# Patient Record
Sex: Female | Born: 2000 | Race: White | Hispanic: No | Marital: Single | Smoking: Never smoker
Health system: Southern US, Community
[De-identification: ages and names within clinical notes are randomized; demographics above are authoritative.]

## PROBLEM LIST (undated history)

## (undated) DIAGNOSIS — K219 Gastro-esophageal reflux disease without esophagitis: Secondary | ICD-10-CM

## (undated) DIAGNOSIS — I10 Essential (primary) hypertension: Secondary | ICD-10-CM

## (undated) DIAGNOSIS — Z8719 Personal history of other diseases of the digestive system: Secondary | ICD-10-CM

## (undated) HISTORY — PX: SMALL INTESTINE SURGERY: SHX150

## (undated) HISTORY — DX: Personal history of other diseases of the digestive system: Z87.19

---
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
## 2020-12-05 ENCOUNTER — Other Ambulatory Visit: Payer: Self-pay

## 2020-12-05 ENCOUNTER — Encounter (HOSPITAL_COMMUNITY): Payer: Self-pay

## 2020-12-05 ENCOUNTER — Emergency Department (HOSPITAL_COMMUNITY)
Admission: EM | Admit: 2020-12-05 | Discharge: 2020-12-06 | Disposition: A | Discharge status: Home / Self Care | Payer: Self-pay | Attending: Emergency Medicine | Admitting: Emergency Medicine

## 2020-12-05 ENCOUNTER — Emergency Department (HOSPITAL_COMMUNITY): Payer: Self-pay

## 2020-12-05 DIAGNOSIS — W08XXXA Fall from other furniture, initial encounter: Secondary | ICD-10-CM | POA: Insufficient documentation

## 2020-12-05 DIAGNOSIS — W19XXXA Unspecified fall, initial encounter: Secondary | ICD-10-CM

## 2020-12-05 DIAGNOSIS — S42471A Displaced transcondylar fracture of right humerus, initial encounter for closed fracture: Secondary | ICD-10-CM | POA: Insufficient documentation

## 2020-12-05 DIAGNOSIS — S42491A Other displaced fracture of lower end of right humerus, initial encounter for closed fracture: Secondary | ICD-10-CM

## 2020-12-05 DIAGNOSIS — S42492A Other displaced fracture of lower end of left humerus, initial encounter for closed fracture: Secondary | ICD-10-CM | POA: Insufficient documentation

## 2020-12-05 DIAGNOSIS — Z20822 Contact with and (suspected) exposure to covid-19: Secondary | ICD-10-CM | POA: Insufficient documentation

## 2020-12-05 MED ORDER — FENTANYL CITRATE (PF) 100 MCG/2ML IJ SOLN
100.0000 ug | Freq: Once | INTRAMUSCULAR | Status: AC
  Administered 2020-12-06: 100 ug via INTRAVENOUS
  Filled 2020-12-05: qty 2

## 2020-12-05 MED ORDER — ONDANSETRON HCL 4 MG/2ML IJ SOLN
4.0000 mg | Freq: Once | INTRAMUSCULAR | Status: AC
  Administered 2020-12-06: 4 mg via INTRAVENOUS
  Filled 2020-12-05: qty 2

## 2020-12-05 MED ORDER — OXYCODONE-ACETAMINOPHEN 5-325 MG PO TABS
1.0000 | ORAL_TABLET | Freq: Once | ORAL | Status: AC
  Administered 2020-12-05: 1 via ORAL
  Filled 2020-12-05: qty 1

## 2020-12-05 NOTE — ED Provider Notes (Addendum)
[facility]-EMERGENCY DEPT Provider Note   CSN: [id number] Arrival date & time: 12/05/20  2217     History Chief Complaint  Patient presents with   Arm Injury    Vanessa Holmes is a 20 y.o. female.  The history is provided by the patient.  Arm Injury Location:  Elbow Elbow location:  R elbow Pain details:    Quality:  Aching   Radiates to:  Does not radiate   Severity:  Severe   Onset quality:  Sudden   Timing:  Constant   Progression:  Worsening Relieved by:  Nothing Worsened by:  Movement Associated symptoms: decreased range of motion   Associated symptoms: no back pain and no muscle weakness   Patient reports she was standing on a bench when she fell landing on her right arm.  She had sudden onset of pain in the right elbow.  No head injury or LOC. 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/04/2015   Left knee pain 10/02/2015   ADD (attention deficit disorder) 10/02/2015   Preventative health care 09/15/2015   H/O small bowel obstruction     Past Surgical History:  Procedure Laterality Date   SMALL INTESTINE SURGERY     53 days of age, bowel obstruction in Duodenum, redo 3 months later for scar tissure     OB History     Gravida  0   Para  0   Term  0   Preterm  0   AB  0   Living  0      SAB  0   IAB  0   Ectopic  0   Multiple  0   Live Births  0           Family History  Problem Relation Age of Onset   Hypertension Father    Diabetes Father    Cancer Maternal Grandmother        breast cancer in her 40's    Cancer Paternal Grandfather        cancer    Social History   Tobacco Use   Smoking status: Never   Smokeless tobacco: Never  Substance Use Topics   Alcohol use: No    Alcohol/week: 0.0 standard drinks   Drug use: No    Home Medications Prior to Admission medications   Medication Sig Start Date End Date Taking? Authorizing Provider  desogestrel-ethinyl estradiol (KARIVA) 0.15-0.02/0.01 MG (21/5) tablet Take 1 tablet by mouth daily. Patient not taking: Reported on 10/15/2017 02/28/17   Bradd Canary, MD  ibuprofen (ADVIL,MOTRIN) 200 MG tablet Take 200 mg by mouth every 6 (six) hours as needed.    [provider]  lisdexamfetamine (VYVANSE) 20 MG capsule Take 20 mg by mouth daily.    [provider]  norgestimate-ethinyl estradiol (ORTHO-CYCLEN,SPRINTEC,PREVIFEM) 0.25-35 MG-MCG tablet Take 1 tablet by mouth daily. Patient not taking: Reported on 05/07/2018 10/15/17   Currie Paris, NP    Allergies    Sulfa antibiotics  Review of Systems   Review of Systems  Gastrointestinal:  Negative  for abdominal pain.  Musculoskeletal:  Positive for arthralgias and joint swelling. Negative for back pain.  Neurological:  Negative for headaches.  All other systems reviewed and are negative.  Physical Exam Updated Vital Signs BP 128/75   Pulse 75   Temp 98.4 F (36.9 C) (Oral)   Resp 16   SpO2 96%   Physical Exam CONSTITUTIONAL: Well developed/well nourished HEAD: Normocephalic/atraumatic, no signs of trauma EYES: EOMI ENMT: Mucous membranes moist NECK: supple no meningeal signs SPINE/BACK: No cervical spine tenderness CV: S1/S2 noted, no murmurs/rubs/gallops noted LUNGS: Lungs are clear to auscultation bilaterally, no apparent distress ABDOMEN: soft, nontender NEURO: Pt is awake/alert/appropriate, moves all extremitiesx4.  No facial droop.  She is able to move the fingers on the right hand.  She reports numbness to the right hand EXTREMITIES: Obvious closed  deformity to the right elbow.  No lacerations.  Distal pulses are intact in both upper extremities. SKIN: warm, color normal PSYCH: Anxious  ED Results / Procedures / Treatments   Labs (all labs ordered are listed, but only abnormal results are displayed) Labs Reviewed  CBC - Abnormal; Notable for the following components:      Result Value   WBC 26.4 (*)    Platelets 404 (*)    All other components within normal limits  BASIC METABOLIC PANEL - Abnormal; Notable for the following components:   Potassium 3.4 (*)    Glucose, Bld 133 (*)    BUN 21 (*)    Creatinine, Ser 1.04 (*)    All other components within normal limits  RESP PANEL BY RT-PCR (FLU A&B, COVID) ARPGX2  I-STAT BETA HCG BLOOD, ED ([facility], [facility], [facility] ONLY)    EKG None  Radiology DG Elbow 2 Views Right  Result Date: 12/05/2020 CLINICAL DATA:  Fall, right elbow deformity EXAM: RIGHT ELBOW - 2 VIEW COMPARISON:  None. FINDINGS: Two view radiograph right elbow demonstrates a comminuted transcondylar/intra-articular fracture of the a distal right humerus with marked posterior angulation, 2 shaft with posterior displacement and approximately 3.5-4 cm override of the fracture fragments with superior displacement of the a distal humeral fracture fragments. Radiocapitellar alignment appears preserved. The trochlear groove appears disrupted with resultant articular incongruity involving the ulnohumeral articulation. IMPRESSION: Comminuted transcondylar/intra-articular fracture of the distal right humerus, as described above. Electronically Signed   By: Helyn Numbers MD   On: 12/05/2020 23:09   DG Humerus Right  Result Date: 12/05/2020 CLINICAL DATA:  Fall, right upper extremity deformity EXAM: RIGHT HUMERUS - 2+ VIEW COMPARISON:  None. FINDINGS: Two view radiograph right humerus demonstrates a comminuted intra-articular fracture of the distal right humerus with transchondral fracture planes as well as fracture planes extending into the  trochlear groove. There is marked override and displacement of the fracture fragments, not well profiled on this examination. There is articular incongruity involving the ulnohumeral articulation. IMPRESSION: Comminuted intra-articular, displaced fracture of the distal right humerus with resulting malalignment of the ulnohumeral articulation. Electronically Signed   By: Helyn Numbers MD   On: 12/05/2020 23:12    Procedures Procedures   Medications Ordered in ED Medications  fentaNYL (SUBLIMAZE) injection 100 mcg (has no administration in time range)  sodium chloride 0.9 % bolus 1,000 mL (1,000 mLs Intravenous New Bag/Given 12/06/20 0151)  oxyCODONE-acetaminophen (PERCOCET/ROXICET) 5-325 MG per tablet 1 tablet (1 tablet Oral Given 12/05/20 2312)  fentaNYL (SUBLIMAZE) injection 100 mcg (100 mcg Intravenous Given 12/06/20 0055)  ondansetron (ZOFRAN) injection 4 mg (4 mg Intravenous Given 12/06/20 0052)  ED Course  I have reviewed the triage vital signs and the nursing notes.  Pertinent labs & imaging results that were available during my care of the patient were reviewed by me and considered in my medical decision making (see chart for details).    MDM Rules/Calculators/A&P                         Patient with obvious fracture dislocation of the right elbow.  This is a closed injury 23:30 Discussed with Dr. Ophelia Charter with orthopedics.  He request to consult hand surgery 2345 Discussed with Dr. Merlyn Lot with hand surgery.  He reports he does not operate on elbows and requests call back to orthopedic 2350 D/w Dr Ophelia Charter who will see patient 12:41 AM Seen by Dr. Ophelia Charter.  He has splinted the patient and she is improved.  Plan will be to hold in the ER overnight and plan for OR in the morning. 1:58 AM Patient stable.  She will rest in the ER overnight until her surgery in the morning Final Clinical Impression(s) / ED Diagnoses Final diagnoses:  Fall  Other closed displaced fracture of distal end of  right humerus, initial encounter    Rx / DC Orders ED Discharge Orders     None        Zadie Rhine, MD 12/06/20 3662    Zadie Rhine, MD 12/06/20 0159

## 2020-12-05 NOTE — ED Triage Notes (Signed)
Pt BIB EMS. Pt was sitting on a bench and it fell backwards. Pt injured her right arms. Pt has obvious deformity to right elbow. Denies hitting her head or LOC.   22G LH 150 mcg Fentanyl

## 2020-12-05 NOTE — ED Provider Notes (Signed)
Emergency Medicine Provider Triage Evaluation Note  Vanessa Holmes , a 20 y.o. female  was evaluated in triage.  Pt complains of right elbow and arm pain.  Just prior to arrival sitting on a bench and fell backwards.  Injured right arm and elbow.  No pain elsewhere.  Did not hit head or lose consciousness.  No neck or back pain.  Review of Systems  Positive: R arm pain Negative: Headache, numbness  Physical Exam  BP 128/75   Pulse 75   Temp 98.4 F (36.9 C) (Oral)   Resp 16   SpO2 96%  Gen:   Awake, no distress   Resp:  Normal effort  MSK:   Deformity of his distal right upper arm.  Patient in splint limiting exam.  Radial pulse 2+.  Good distal sensation. Other:    Medical Decision Making  Medically screening exam initiated at 10:44 PM.  Appropriate orders placed.  Vanessa Holmes was informed that the remainder of the evaluation will be completed by another provider, this initial triage assessment does not replace that evaluation, and the importance of remaining in the ED until their evaluation is complete.  Xrays ordered   Vanessa Apley, PA-C 12/05/20 2245    Tilden Fossa, MD 12/05/20 2256

## 2020-12-06 ENCOUNTER — Encounter (HOSPITAL_COMMUNITY): Payer: Self-pay | Admitting: Orthopaedic Surgery

## 2020-12-06 ENCOUNTER — Ambulatory Visit: Payer: Self-pay | Admitting: Student

## 2020-12-06 DIAGNOSIS — S42471A Displaced transcondylar fracture of right humerus, initial encounter for closed fracture: Secondary | ICD-10-CM

## 2020-12-06 DIAGNOSIS — S42491A Other displaced fracture of lower end of right humerus, initial encounter for closed fracture: Secondary | ICD-10-CM

## 2020-12-06 DIAGNOSIS — S42401A Unspecified fracture of lower end of right humerus, initial encounter for closed fracture: Secondary | ICD-10-CM | POA: Insufficient documentation

## 2020-12-06 LAB — RESP PANEL BY RT-PCR (FLU A&B, COVID) ARPGX2
Influenza A by PCR: NEGATIVE
Influenza B by PCR: NEGATIVE
SARS Coronavirus 2 by RT PCR: NEGATIVE

## 2020-12-06 LAB — BASIC METABOLIC PANEL
Anion gap: 10 (ref 5–15)
BUN: 21 mg/dL — ABNORMAL HIGH (ref 6–20)
CO2: 24 mmol/L (ref 22–32)
Calcium: 9.8 mg/dL (ref 8.9–10.3)
Chloride: 106 mmol/L (ref 98–111)
Creatinine, Ser: 1.04 mg/dL — ABNORMAL HIGH (ref 0.44–1.00)
GFR, Estimated: >60 mL/min (ref >60)
Glucose, Bld: 133 mg/dL — ABNORMAL HIGH (ref 70–99)
Potassium: 3.4 mmol/L — ABNORMAL LOW (ref 3.5–5.1)
Sodium: 140 mmol/L (ref 135–145)

## 2020-12-06 LAB — CBC
HCT: 42.4 % (ref 36.0–46.0)
Hemoglobin: 14.3 g/dL (ref 12.0–15.0)
MCH: 30.8 pg (ref 26.0–34.0)
MCHC: 33.7 g/dL (ref 30.0–36.0)
MCV: 91.2 fL (ref 80.0–100.0)
Platelets: 404 10*3/uL — ABNORMAL HIGH (ref 150–400)
RBC: 4.65 MIL/uL (ref 3.87–5.11)
RDW: 12.6 % (ref 11.5–15.5)
WBC: 26.4 10*3/uL — ABNORMAL HIGH (ref 4.0–10.5)
nRBC: 0 % (ref 0.0–0.2)

## 2020-12-06 LAB — I-STAT BETA HCG BLOOD, ED (MC, WL, AP ONLY): I-stat hCG, quantitative: <5 m[IU]/mL (ref <5)

## 2020-12-06 MED ORDER — HYDROCODONE-ACETAMINOPHEN 5-325 MG PO TABS: 1.0000 | ORAL_TABLET | Freq: Four times a day (QID) | ORAL | 0 refills | Status: AC | PRN

## 2020-12-06 MED ORDER — ONDANSETRON HCL 4 MG/2ML IJ SOLN
4.0000 mg | Freq: Four times a day (QID) | INTRAMUSCULAR | Status: DC
  Administered 2020-12-06: 4 mg via INTRAVENOUS
  Filled 2020-12-06: qty 2

## 2020-12-06 MED ORDER — MORPHINE SULFATE (PF) 4 MG/ML IV SOLN
4.0000 mg | INTRAVENOUS | Status: DC | PRN
  Administered 2020-12-06 (×2): 4 mg via INTRAVENOUS
  Filled 2020-12-06 (×2): qty 1

## 2020-12-06 MED ORDER — SODIUM CHLORIDE 0.9 % IV BOLUS (SEPSIS)
1000.0000 mL | Freq: Once | INTRAVENOUS | Status: AC
  Administered 2020-12-06: 1000 mL via INTRAVENOUS

## 2020-12-06 MED ORDER — FENTANYL CITRATE (PF) 100 MCG/2ML IJ SOLN
100.0000 ug | INTRAMUSCULAR | Status: DC | PRN
  Administered 2020-12-06 (×2): 100 ug via INTRAVENOUS
  Filled 2020-12-06 (×2): qty 2

## 2020-12-06 MED ORDER — ONDANSETRON 8 MG PO TBDP: 8.0000 mg | ORAL_TABLET | Freq: Three times a day (TID) | ORAL | 0 refills | Status: AC | PRN

## 2020-12-06 NOTE — ED Notes (Signed)
Father at bedside.

## 2020-12-06 NOTE — ED Provider Notes (Signed)
Updated on plan.  Discussed with Dr. Ophelia Charter.  He has  posted her for surgical repair tomorrow June 29 Patient feels comfortable with this plan.  Distal neurovascular intact in right hand.  She has had some soreness in her right shoulder, but humerus  x-ray was negative for the shoulder joint  Dr. Ophelia Charter will call patient later this morning with surgical plan. Pain medications and Zofran have been sent to her pharmacy   Zadie Rhine, MD 12/06/20 [phone]

## 2020-12-06 NOTE — Progress Notes (Signed)
PCP - Heidi Dach PA-C @ [facility] Cardiologist - denies EKG - DOS Chest x-ray - n/a ECHO - denies Cardiac Cath - denies CPAP - denies  Blood Thinner Instructions: None Aspirin Instructions: n/a  ERAS Protcol - Clear liquids until 9:30  COVID TEST- negative on 6/27 @ ED  Anesthesia review: NO  Pt posted currently for surgery with Dr. Ophelia Charter.  Pt states she just completed an appt with Dr. Jena Gauss, surgery date, time, and surgeon may change.  -------------  SDW INSTRUCTIONS:  Your procedure is scheduled on June 29. Please report to [facility] Main Entrance "A" at 10:00 A.M., and check in at the Admitting office. Call this number if you have problems the morning of surgery: [phone]   Remember: Do not eat after midnight the night before your surgery  You may drink clear liquids until 09:30 the morning of your surgery.   Clear liquids allowed are: Water, Non-Citrus Juices (without pulp), Carbonated Beverages, Clear Tea, Black Coffee Only, and Gatorade   Medications to take morning of surgery with a sip of water include: If needed: Norco Zofran   As of today, STOP taking any Aspirin (unless otherwise instructed by your surgeon), Aleve, Naproxen, Ibuprofen, Motrin, Advil, Goody's, BC's, all herbal medications, fish oil, and all vitamins.    The Morning of Surgery Do not wear jewelry, make-up or nail polish. Do not wear lotions, powders, or perfumes/colognes, or deodorant Do not shave 48 hours prior to surgery.   Men may shave face and neck. Do not bring valuables to the hospital. [facility] is not responsible for any belongings or valuables.  If you are a smoker, DO NOT Smoke 24 hours prior to surgery If you wear a CPAP at night please bring your mask the morning of surgery  Remember that you must have someone to transport you home after your surgery, and remain with you for 24 hours if you are discharged the same day.  Please bring cases for contacts, glasses,  hearing aids, dentures or bridgework because it cannot be worn into surgery.   Patients discharged the day of surgery will not be allowed to drive home.   Please shower the NIGHT BEFORE/MORNING OF SURGERY (use antibacterial soap like DIAL soap if possible). Wear comfortable clothes the morning of surgery. Oral Hygiene is also important to reduce your risk of infection.  Remember - BRUSH YOUR TEETH THE MORNING OF SURGERY WITH YOUR REGULAR TOOTHPASTE  Patient denies shortness of breath, fever, cough and chest pain.

## 2020-12-06 NOTE — Progress Notes (Addendum)
Comminuted distal humerus Fx closed, intra-artic T condylar.  Splinted by me . Will talk with Trauma Ortho MD ( Haddix or Handy ) in AM about transfer to [facility] for surgery.   Ulnar , median, radial nerve intact.   Patient's cell is [phone]

## 2020-12-06 NOTE — Consult Note (Signed)
Reason for Consult: Closed right comminuted transcondylar intracondylar fracture distal right humerus Referring Physician: Zadie Rhine MD  Vanessa Holmes is an 20 y.o. female.  HPI: 20 year old female stood up on a bench to yell at her dog behind her parents business and bench tipped over with fall landing on outstretched dominant right upper extremity with close distal humerus comminuted fracture intra-articular.  She was splinted in extension with a foam splint by EMS.  She rates her pain is severe.  No past history of injury to the arm.  Patient is a Consulting civil engineer at [employer].  Past Medical History:  Diagnosis Date   H/O small bowel obstruction     Past Surgical History:  Procedure Laterality Date   SMALL INTESTINE SURGERY     56 days of age, bowel obstruction in Duodenum, redo 3 months later for scar tissure    Family History  Problem Relation Age of Onset   Hypertension Father    Diabetes Father    Cancer Maternal Grandmother        breast cancer in her 47's   Cancer Paternal Grandfather        cancer    Social History:  reports that she has never smoked. She has never used smokeless tobacco. She reports that she does not drink alcohol and does not use drugs.  Allergies:  Allergies  Allergen Reactions   Sulfa Antibiotics Hives and Rash    Medications: I have reviewed the patient's current medications.  Results for orders placed or performed during the hospital encounter of 12/05/20 (from the past 48 hour(s))  CBC     Status: Abnormal   Collection Time: 12/05/20 11:29 PM  Result Value Ref Range   WBC 26.4 (H) 4.0 - 10.5 K/uL   RBC 4.65 3.87 - 5.11 MIL/uL   Hemoglobin 14.3 12.0 - 15.0 g/dL   HCT 73.2 20.2 - 54.2 %   MCV 91.2 80.0 - 100.0 fL   MCH 30.8 26.0 - 34.0 pg   MCHC 33.7 30.0 - 36.0 g/dL   RDW 70.6 23.7 - 62.8 %   Platelets 404 (H) 150 - 400 K/uL   nRBC 0.0 0.0 - 0.2 %    Comment: Performed at [facility], [address], [place], [place] [address]  Basic metabolic panel     Status: Abnormal   Collection Time: 12/05/20 11:29 PM  Result Value Ref Range   Sodium 140 135 - 145 mmol/L   Potassium 3.4 (L) 3.5 - 5.1 mmol/L   Chloride 106 98 - 111 mmol/L   CO2 24 22 - 32 mmol/L   Glucose, Bld 133 (H) 70 - 99 mg/dL    Comment: Glucose reference range applies only to samples taken after fasting for at least 8 hours.   BUN 21 (H) 6 - 20 mg/dL   Creatinine, Ser 6.16 (H) 0.44 - 1.00 mg/dL   Calcium 9.8 8.9 - 07.3 mg/dL   GFR, Estimated >71 >06 mL/min    Comment: (NOTE) Calculated using the CKD-EPI Creatinine Equation (2021)    Anion gap 10 5 - 15    Comment: Performed at [facility], [address] [place], [place] [address]  I-Stat Beta hCG blood, ED ([facility], [facility], [facility] only)     Status: None   Collection Time: 12/06/20 12:58 AM  Result Value Ref Range   I-stat hCG, quantitative <5.0 <5 mIU/mL   Comment 3  Comment:   GEST. AGE      CONC.  (mIU/mL)   <=1 WEEK        5 - 50     2 WEEKS       50 - 500     3 WEEKS       100 - 10,000     4 WEEKS     1,000 - 30,000        FEMALE AND NON-PREGNANT FEMALE:     LESS THAN 5 mIU/mL   Resp Panel by RT-PCR (Flu A&B, Covid) Nasopharyngeal Swab     Status: None   Collection Time: 12/06/20  1:06 AM   Specimen: Nasopharyngeal Swab; Nasopharyngeal(NP) swabs in vial transport medium  Result Value Ref Range   SARS Coronavirus 2 by RT PCR NEGATIVE NEGATIVE    Comment: (NOTE) SARS-CoV-2 target nucleic acids are NOT DETECTED.  The SARS-CoV-2 RNA is generally detectable in upper respiratory specimens during the acute phase of infection. The lowest concentration of SARS-CoV-2 viral copies this assay can detect is 138 copies/mL. A negative result does not preclude SARS-Cov-2 infection and should not be used as the sole basis for treatment or other patient management decisions. A negative result may occur with  improper specimen collection/handling,  submission of specimen other than nasopharyngeal swab, presence of viral mutation(s) within the areas targeted by this assay, and inadequate number of viral copies(<138 copies/mL). A negative result must be combined with clinical observations, patient history, and epidemiological information. The expected result is Negative.  Fact Sheet for Patients:  BloggerCourse.com  Fact Sheet for Healthcare Providers:  SeriousBroker.it  This test is no t yet approved or cleared by the [place] FDA and  has been authorized for detection and/or diagnosis of SARS-CoV-2 by FDA under an Emergency Use Authorization (EUA). This EUA will remain  in effect (meaning this test can be used) for the duration of the COVID-19 declaration under Section 564(b)(1) of the Act, 21 U.S.C.section 360bbb-3(b)(1), unless the authorization is terminated  or revoked sooner.       Influenza A by PCR NEGATIVE NEGATIVE   Influenza B by PCR NEGATIVE NEGATIVE    Comment: (NOTE) The Xpert Xpress SARS-CoV-2/FLU/RSV plus assay is intended as an aid in the diagnosis of influenza from Nasopharyngeal swab specimens and should not be used as a sole basis for treatment. Nasal washings and aspirates are unacceptable for Xpert Xpress SARS-CoV-2/FLU/RSV testing.  Fact Sheet for Patients: BloggerCourse.com  Fact Sheet for Healthcare Providers: SeriousBroker.it  This test is not yet approved or cleared by the [place] FDA and has been authorized for detection and/or diagnosis of SARS-CoV-2 by FDA under an Emergency Use Authorization (EUA). This EUA will remain in effect (meaning this test can be used) for the duration of the COVID-19 declaration under Section 564(b)(1) of the Act, 21 U.S.C. section 360bbb-3(b)(1), unless the authorization is terminated or revoked.  Performed at [facility], [address] [place], [place] [address]     DG Elbow 2 Views Right  Result Date: 12/05/2020 CLINICAL DATA:  Fall, right elbow deformity EXAM: RIGHT ELBOW - 2 VIEW COMPARISON:  None. FINDINGS: Two view radiograph right elbow demonstrates a comminuted transcondylar/intra-articular fracture of the a distal right humerus with marked posterior angulation, 2 shaft with posterior displacement and approximately 3.5-4 cm override of the fracture fragments with superior displacement of the a distal humeral fracture fragments. Radiocapitellar alignment appears preserved. The trochlear groove appears disrupted with resultant articular incongruity involving  the ulnohumeral articulation. IMPRESSION: Comminuted transcondylar/intra-articular fracture of the distal right humerus, as described above. Electronically Signed   By: Helyn Numbers MD   On: 12/05/2020 23:09   DG Humerus Right  Result Date: 12/05/2020 CLINICAL DATA:  Fall, right upper extremity deformity EXAM: RIGHT HUMERUS - 2+ VIEW COMPARISON:  None. FINDINGS: Two view radiograph right humerus demonstrates a comminuted intra-articular fracture of the distal right humerus with transchondral fracture planes as well as fracture planes extending into the trochlear groove. There is marked override and displacement of the fracture fragments, not well profiled on this examination. There is articular incongruity involving the ulnohumeral articulation. IMPRESSION: Comminuted intra-articular, displaced fracture of the distal right humerus with resulting malalignment of the ulnohumeral articulation. Electronically Signed   By: Helyn Numbers MD   On: 12/05/2020 23:12    ROS 14 point system otherwise negative as pertains HPI. Blood pressure 132/86, pulse 91, temperature 98.4 F (36.9 C), temperature source Oral, resp. rate 14, SpO2 100 %. Physical Exam Constitutional:      Appearance: Normal appearance.  HENT:     Head: Normocephalic.     Right Ear: Tympanic membrane  normal.     Left Ear: Tympanic membrane normal.     Nose: Nose normal.     Mouth/Throat:     Mouth: Mucous membranes are moist.  Eyes:     Extraocular Movements: Extraocular movements intact.  Cardiovascular:     Rate and Rhythm: Tachycardia present.  Pulmonary:     Effort: Pulmonary effort is normal.     Breath sounds: No wheezing.  Abdominal:     General: Abdomen is flat.  Musculoskeletal:        General: Swelling and deformity present.     Cervical back: Normal range of motion.  Skin:    General: Skin is warm.  Neurological:     General: No focal deficit present.     Mental Status: She is alert.  Median radial ulnar sensation is intact she is able to move her finger slightly.  Swelling of the distal arm and elbow region.  Compartments of the forearm are soft.  Assessment/Plan: Splint was applied by me well-padded.  We will post surgery for Wednesday afternoon open reduction internal fixation right distal humerus.  Discussed olecranon osteotomy plate fixation overnight stay in the hospital.  Questions elicited and answered she understands request to proceed.  Eldred Manges 12/06/2020, 7:31 AM

## 2020-12-06 NOTE — ED Notes (Signed)
Patient was given pain medicine. Boyfriend at bedside.

## 2020-12-07 ENCOUNTER — Inpatient Hospital Stay (HOSPITAL_COMMUNITY): Admission: RE | Admit: 2020-12-07 | Payer: Self-pay | Source: Home / Self Care | Admitting: Orthopaedic Surgery

## 2020-12-07 HISTORY — DX: Essential (primary) hypertension: I10

## 2020-12-07 HISTORY — DX: Gastro-esophageal reflux disease without esophagitis: K21.9

## 2020-12-07 SURGERY — OPEN REDUCTION INTERNAL FIXATION (ORIF) ELBOW/OLECRANON FRACTURE
Anesthesia: Choice | Site: Elbow | Laterality: Right

## 2020-12-07 NOTE — Progress Notes (Signed)
Called pt and left message that she needs to be at [facility] at 0530 on 12/08/20 for her surgery by Dr. Jena Gauss . Informed her that she could have clear liquids until 0430. Asked her to call PAT office at [phone] for confirmation that she received this message. Pt's cell phone number is [phone].

## 2020-12-07 NOTE — Anesthesia Preprocedure Evaluation (Addendum)
Anesthesia Evaluation  Patient identified by MRN, date of birth, ID band Patient awake    Reviewed: Allergy & Precautions, NPO status , Patient's Chart, lab work & pertinent test results  Airway Mallampati: II  TM Distance: >3 FB Neck ROM: Full    Dental  (+) Dental Advisory Given   Pulmonary neg pulmonary ROS,    breath sounds clear to auscultation       Cardiovascular hypertension, negative cardio ROS   Rhythm:Regular Rate:Normal     Neuro/Psych negative neurological ROS     GI/Hepatic Neg liver ROS, GERD  ,  Endo/Other  negative endocrine ROS  Renal/GU negative Renal ROS     Musculoskeletal   Abdominal   Peds  Hematology negative hematology ROS (+)   Anesthesia Other Findings   Reproductive/Obstetrics                            Lab Results  Component Value Date   WBC 26.4 (H) 12/05/2020   HGB 14.3 12/05/2020   HCT 42.4 12/05/2020   MCV 91.2 12/05/2020   PLT 404 (H) 12/05/2020   Lab Results  Component Value Date   CREATININE 1.04 (H) 12/05/2020   BUN 21 (H) 12/05/2020   NA 140 12/05/2020   K 3.4 (L) 12/05/2020   CL 106 12/05/2020   CO2 24 12/05/2020    Anesthesia Physical Anesthesia Plan  ASA: 2  Anesthesia Plan: General   Post-op Pain Management:  Regional for Post-op pain   Induction: Intravenous  PONV Risk Score and Plan: 3 and Ondansetron, Dexamethasone, Midazolam and Treatment may vary due to age or medical condition  Airway Management Planned: Oral ETT  Additional Equipment: None  Intra-op Plan:   Post-operative Plan: Extubation in OR  Informed Consent: I have reviewed the patients History and Physical, chart, labs and discussed the procedure including the risks, benefits and alternatives for the proposed anesthesia with the patient or authorized representative who has indicated his/her understanding and acceptance.       Plan Discussed with:    Anesthesia Plan Comments: ( )       Anesthesia Quick Evaluation

## 2020-12-07 NOTE — Progress Notes (Signed)
Anesthesia Chart Review: SAME DAY WORK-UP  Case: [id number] Date/Time: 12/08/20 0715   Procedure: OPEN REDUCTION INTERNAL FIXATION (ORIF) DISTAL HUMERUS FRACTURE (Right)   Anesthesia type: General   Diagnosis: Other closed displaced fracture of distal end of right humerus, initial encounter [S42.491A]   Pre-op diagnosis: Right supracondylar humerus fracture   [facility] / [facility]   Surgeons: Roby Lofts, MD       DISCUSSION: Patient is a 20 year old female scheduled for the above procedure. History includes HTN, GERD, and SBO s/p surgery x2 as a newborn.   She presented to the [facility] ED late on 12/05/20 with right elbow pain and deformity after a bench she was seated on fell backwards injuring her right arm. Xray showed closed comminuted distal humerus fracture. She was evaluated by orthopedist Annell Greening, MD. Ulnar, median, and radial nerves intact. Splint applied with initial plans to transfer to [facility] for surgery on 12/07/20; however, she instead was discharged home and surgery rescheduled with Orthopedic Trauma service, Dr. Jena Gauss on 11/28/20.    COVID-19 test negative on 12/06/20. ISTAT B-HCG quantitative negative (< 5) on 12/06/20. CBC and BMET in the ED on 12/05/20 showed Cr 1.04, K 3.4, glucose 133, WBC 26.4, H/H 14.3/42.4, PLT 404. I don't have any recent comparison WBC results, so unsure if leukocytosis is new or considered reactive, or other. Labs were done in the ED and reviewed by provider. She was felt appropriate for discharge home. PAT RN also called WBC results to Dr. Jena Gauss with no new orders given. Anesthesia team and Dr. Jena Gauss to further evaluate on the day of surgery--will defer additional lab orders, if any, to them.    VS: 12/05/20: BP 132/86, HR 91 bpm, T 36.9C    PROVIDERS: Lurline Hare, PA-C is PCP [facility])   LABS: See DISCUSSION. Lab Results  Component Value Date   WBC 26.4 (H) 12/05/2020   HGB 14.3 12/05/2020   HCT 42.4 12/05/2020   PLT 404 (H)  12/05/2020   GLUCOSE 133 (H) 12/05/2020   NA 140 12/05/2020   K 3.4 (L) 12/05/2020   CL 106 12/05/2020   CREATININE 1.04 (H) 12/05/2020   BUN 21 (H) 12/05/2020   CO2 24 12/05/2020    IMAGES: Xray right elbow 12/05/20: FINDINGS: Two view radiograph right elbow demonstrates a comminuted transcondylar/intra-articular fracture of the a distal right humerus with marked posterior angulation, 2 shaft with posterior displacement and approximately 3.5-4 cm override of the fracture fragments with superior displacement of the a distal humeral fracture fragments. Radiocapitellar alignment appears preserved. The trochlear groove appears disrupted with resultant articular incongruity involving the ulnohumeral articulation. IMPRESSION: Comminuted transcondylar/intra-articular fracture of the distal right humerus, as described above.  Xray right humerus 12/05/20: IMPRESSION: Comminuted intra-articular, displaced fracture of the distal right humerus with resulting malalignment of the ulnohumeral articulation.    EKG: N/A   CV: N/A  Past Medical History:  Diagnosis Date   GERD (gastroesophageal reflux disease)    H/O small bowel obstruction    Hypertension     Past Surgical History:  Procedure Laterality Date   SMALL INTESTINE SURGERY     29 days of age, bowel obstruction in Duodenum, redo 3 months later for scar tissure    MEDICATIONS: No current facility-administered medications for this encounter.    Ginger, Zingiber officinalis, (GINGER ROOT PO)   HYDROcodone-acetaminophen (NORCO/VICODIN) 5-325 MG tablet   ibuprofen (ADVIL,MOTRIN) 200 MG tablet   ondansetron (ZOFRAN ODT) 8 MG  disintegrating tablet   VYVANSE 40 MG CHEW    Shonna Chock, PA-C Surgical Short Stay/Anesthesiology [facility] Phone [phone] [facility] Phone [phone] 12/07/2020 3:35 PM

## 2020-12-07 NOTE — Progress Notes (Signed)
Spoke with patient on the phone. Notified her that she needs to be at hospital at 0530 tomorrow morning and that she can have clear liquids until 0430. After that, nothing to drink;otherwise preop instructions remain the same as those she received from Sharyn Creamer.

## 2020-12-07 NOTE — Progress Notes (Signed)
Notified Francoise Ceo PA via secure chat and Dr. Jena Gauss via phone call that pt's WBC's on 6/27 CBC were 26. No new orders from Dr. Jena Gauss.

## 2020-12-08 ENCOUNTER — Encounter (HOSPITAL_COMMUNITY)
Admission: RE | Disposition: A | Discharge status: Home / Self Care | Payer: Self-pay | Source: Home / Self Care | Attending: Student

## 2020-12-08 ENCOUNTER — Ambulatory Visit (HOSPITAL_COMMUNITY)
Admission: RE | Admit: 2020-12-08 | Discharge: 2020-12-08 | Disposition: A | Discharge status: Home / Self Care | Payer: Self-pay | Attending: Student | Admitting: Student

## 2020-12-08 ENCOUNTER — Ambulatory Visit (HOSPITAL_COMMUNITY): Payer: Self-pay

## 2020-12-08 ENCOUNTER — Other Ambulatory Visit: Payer: Self-pay

## 2020-12-08 ENCOUNTER — Ambulatory Visit (HOSPITAL_COMMUNITY): Payer: Self-pay | Admitting: Certified Registered Nurse Anesthetist

## 2020-12-08 ENCOUNTER — Encounter (HOSPITAL_COMMUNITY): Payer: Self-pay | Admitting: Student

## 2020-12-08 DIAGNOSIS — S42491A Other displaced fracture of lower end of right humerus, initial encounter for closed fracture: Secondary | ICD-10-CM | POA: Insufficient documentation

## 2020-12-08 DIAGNOSIS — S42401A Unspecified fracture of lower end of right humerus, initial encounter for closed fracture: Secondary | ICD-10-CM | POA: Insufficient documentation

## 2020-12-08 DIAGNOSIS — K219 Gastro-esophageal reflux disease without esophagitis: Secondary | ICD-10-CM | POA: Insufficient documentation

## 2020-12-08 DIAGNOSIS — I10 Essential (primary) hypertension: Secondary | ICD-10-CM | POA: Insufficient documentation

## 2020-12-08 DIAGNOSIS — Z882 Allergy status to sulfonamides status: Secondary | ICD-10-CM | POA: Insufficient documentation

## 2020-12-08 DIAGNOSIS — T148XXA Other injury of unspecified body region, initial encounter: Secondary | ICD-10-CM

## 2020-12-08 DIAGNOSIS — W19XXXA Unspecified fall, initial encounter: Secondary | ICD-10-CM | POA: Insufficient documentation

## 2020-12-08 DIAGNOSIS — S42411A Displaced simple supracondylar fracture without intercondylar fracture of right humerus, initial encounter for closed fracture: Secondary | ICD-10-CM | POA: Insufficient documentation

## 2020-12-08 DIAGNOSIS — Z419 Encounter for procedure for purposes other than remedying health state, unspecified: Secondary | ICD-10-CM

## 2020-12-08 HISTORY — PX: ORIF HUMERUS FRACTURE: SHX2126

## 2020-12-08 SURGERY — OPEN REDUCTION INTERNAL FIXATION (ORIF) DISTAL HUMERUS FRACTURE
Anesthesia: General | Site: Arm Upper | Laterality: Right

## 2020-12-08 MED ORDER — ONDANSETRON HCL 4 MG/2ML IJ SOLN
INTRAMUSCULAR | Status: AC
  Filled 2020-12-08: qty 2

## 2020-12-08 MED ORDER — ACETAMINOPHEN 500 MG PO TABS: 1000.0000 mg | ORAL_TABLET | Freq: Once | ORAL | Status: DC

## 2020-12-08 MED ORDER — CEFAZOLIN SODIUM-DEXTROSE 2-4 GM/100ML-% IV SOLN
2.0000 g | INTRAVENOUS | Status: AC
  Administered 2020-12-08: 2 g via INTRAVENOUS
  Filled 2020-12-08: qty 100

## 2020-12-08 MED ORDER — CHLORHEXIDINE GLUCONATE 0.12 % MT SOLN
15.0000 mL | Freq: Once | OROMUCOSAL | Status: AC
  Administered 2020-12-08: 15 mL via OROMUCOSAL

## 2020-12-08 MED ORDER — ACETAMINOPHEN 325 MG PO TABS
ORAL_TABLET | ORAL | Status: AC
  Administered 2020-12-08: 650 mg via ORAL
  Filled 2020-12-08: qty 2

## 2020-12-08 MED ORDER — FENTANYL CITRATE (PF) 100 MCG/2ML IJ SOLN: 25.0000 ug | INTRAMUSCULAR | Status: DC | PRN

## 2020-12-08 MED ORDER — ROCURONIUM BROMIDE 10 MG/ML (PF) SYRINGE
PREFILLED_SYRINGE | INTRAVENOUS | Status: AC
  Filled 2020-12-08: qty 10

## 2020-12-08 MED ORDER — CLONIDINE HCL (ANALGESIA) 100 MCG/ML EP SOLN
EPIDURAL | Status: DC | PRN
  Administered 2020-12-08: 50 ug

## 2020-12-08 MED ORDER — ROCURONIUM BROMIDE 100 MG/10ML IV SOLN
INTRAVENOUS | Status: DC | PRN
  Administered 2020-12-08: 50 mg via INTRAVENOUS

## 2020-12-08 MED ORDER — DEXAMETHASONE SODIUM PHOSPHATE 10 MG/ML IJ SOLN
INTRAMUSCULAR | Status: DC | PRN
  Administered 2020-12-08: 10 mg via INTRAVENOUS

## 2020-12-08 MED ORDER — SUGAMMADEX SODIUM 500 MG/5ML IV SOLN
INTRAVENOUS | Status: DC | PRN
  Administered 2020-12-08: 200 mg via INTRAVENOUS

## 2020-12-08 MED ORDER — MIDAZOLAM HCL 2 MG/2ML IJ SOLN
INTRAMUSCULAR | Status: AC
  Filled 2020-12-08: qty 2

## 2020-12-08 MED ORDER — ORAL CARE MOUTH RINSE: 15.0000 mL | Freq: Once | OROMUCOSAL | Status: AC

## 2020-12-08 MED ORDER — FENTANYL CITRATE (PF) 100 MCG/2ML IJ SOLN
INTRAMUSCULAR | Status: DC | PRN
  Administered 2020-12-08: 100 ug via INTRAVENOUS
  Administered 2020-12-08: 50 ug via INTRAVENOUS
  Administered 2020-12-08: 100 ug via INTRAVENOUS

## 2020-12-08 MED ORDER — LACTATED RINGERS IV SOLN: INTRAVENOUS | Status: DC

## 2020-12-08 MED ORDER — BUPIVACAINE HCL (PF) 0.25 % IJ SOLN
INTRAMUSCULAR | Status: AC
  Filled 2020-12-08: qty 30

## 2020-12-08 MED ORDER — PROPOFOL 1000 MG/100ML IV EMUL
INTRAVENOUS | Status: AC
  Filled 2020-12-08: qty 100

## 2020-12-08 MED ORDER — 0.9 % SODIUM CHLORIDE (POUR BTL) OPTIME
TOPICAL | Status: DC | PRN
  Administered 2020-12-08: 1000 mL

## 2020-12-08 MED ORDER — DEXAMETHASONE SODIUM PHOSPHATE 10 MG/ML IJ SOLN
INTRAMUSCULAR | Status: AC
  Filled 2020-12-08: qty 1

## 2020-12-08 MED ORDER — LIDOCAINE 2% (20 MG/ML) 5 ML SYRINGE
INTRAMUSCULAR | Status: AC
  Filled 2020-12-08: qty 5

## 2020-12-08 MED ORDER — FENTANYL CITRATE (PF) 250 MCG/5ML IJ SOLN
INTRAMUSCULAR | Status: AC
  Filled 2020-12-08: qty 5

## 2020-12-08 MED ORDER — LIDOCAINE HCL (CARDIAC) PF 100 MG/5ML IV SOSY
PREFILLED_SYRINGE | INTRAVENOUS | Status: DC | PRN
  Administered 2020-12-08: 20 mg via INTRAVENOUS

## 2020-12-08 MED ORDER — PROPOFOL 10 MG/ML IV BOLUS
INTRAVENOUS | Status: DC | PRN
  Administered 2020-12-08: 200 mg via INTRAVENOUS

## 2020-12-08 MED ORDER — VANCOMYCIN HCL 1000 MG IV SOLR
INTRAVENOUS | Status: DC | PRN
  Administered 2020-12-08: 1000 mg

## 2020-12-08 MED ORDER — ACETAMINOPHEN 325 MG PO TABS: 650.0000 mg | ORAL_TABLET | Freq: Once | ORAL | Status: AC

## 2020-12-08 MED ORDER — VANCOMYCIN HCL 1000 MG IV SOLR
INTRAVENOUS | Status: AC
  Filled 2020-12-08: qty 1000

## 2020-12-08 MED ORDER — MIDAZOLAM HCL 5 MG/5ML IJ SOLN
INTRAMUSCULAR | Status: DC | PRN
  Administered 2020-12-08: 2 mg via INTRAVENOUS

## 2020-12-08 MED ORDER — BUPIVACAINE-EPINEPHRINE (PF) 0.5% -1:200000 IJ SOLN
INTRAMUSCULAR | Status: DC | PRN
  Administered 2020-12-08: 30 mL via PERINEURAL

## 2020-12-08 MED ORDER — ONDANSETRON HCL 4 MG/2ML IJ SOLN
INTRAMUSCULAR | Status: DC | PRN
  Administered 2020-12-08: 4 mg via INTRAVENOUS

## 2020-12-08 MED ORDER — PROMETHAZINE HCL 25 MG/ML IJ SOLN
6.2500 mg | INTRAMUSCULAR | Status: DC | PRN
Start: 2020-12-08 — End: 2020-12-08

## 2020-12-08 SURGICAL SUPPLY — 98 items
BAG COUNTER SPONGE SURGICOUNT (BAG) ×2 IMPLANT
BAG SPNG CNTER NS LX DISP (BAG) ×1
BIT DRILL 2 MINI QC DISP (BIT) ×1 IMPLANT
BIT DRILL 2.0 (BIT) ×2
BIT DRILL 2.5X2.75 QC CALB (BIT) ×1 IMPLANT
BIT DRILL 2XNS DISP SS SM FRAG (BIT) IMPLANT
BIT DRILL 4.8X200 CANN (BIT) ×1 IMPLANT
BIT DRILL CALIBRATED 2.7 (BIT) ×1 IMPLANT
BIT DRL 2XNS DISP SS SM FRAG (BIT) ×1
BLADE AVERAGE 25X9 (BLADE) ×1 IMPLANT
BNDG CMPR 9X4 STRL LF SNTH (GAUZE/BANDAGES/DRESSINGS)
BNDG COHESIVE 4X5 TAN STRL (GAUZE/BANDAGES/DRESSINGS) ×1 IMPLANT
BNDG ELASTIC 3X5.8 VLCR STR LF (GAUZE/BANDAGES/DRESSINGS) ×1 IMPLANT
BNDG ELASTIC 4X5.8 VLCR STR LF (GAUZE/BANDAGES/DRESSINGS) ×1 IMPLANT
BNDG ELASTIC 6X5.8 VLCR STR LF (GAUZE/BANDAGES/DRESSINGS) ×1 IMPLANT
BNDG ESMARK 4X9 LF (GAUZE/BANDAGES/DRESSINGS) IMPLANT
BNDG GAUZE ELAST 4 BULKY (GAUZE/BANDAGES/DRESSINGS) ×2 IMPLANT
BRUSH SCRUB EZ PLAIN DRY (MISCELLANEOUS) ×2 IMPLANT
CLEANER TIP ELECTROSURG 2X2 (MISCELLANEOUS) ×2 IMPLANT
CORD BIPOLAR FORCEPS 12FT (ELECTRODE) ×2 IMPLANT
COVER SURGICAL LIGHT HANDLE (MISCELLANEOUS) ×2 IMPLANT
DRAIN PENROSE 1/4X12 LTX STRL (WOUND CARE) IMPLANT
DRAPE C-ARM 42X72 X-RAY (DRAPES) ×2 IMPLANT
DRAPE C-ARMOR (DRAPES) IMPLANT
DRAPE HALF SHEET 40X57 (DRAPES) ×2 IMPLANT
DRAPE INCISE IOBAN 66X45 STRL (DRAPES) ×1 IMPLANT
DRAPE ORTHO SPLIT 77X108 STRL (DRAPES) ×2
DRAPE SURG ORHT 6 SPLT 77X108 (DRAPES) ×1 IMPLANT
DRAPE U-SHAPE 47X51 STRL (DRAPES) ×2 IMPLANT
DRSG ADAPTIC 3X8 NADH LF (GAUZE/BANDAGES/DRESSINGS) ×1 IMPLANT
DRSG PAD ABDOMINAL 8X10 ST (GAUZE/BANDAGES/DRESSINGS) ×1 IMPLANT
ELECT REM PT RETURN 9FT ADLT (ELECTROSURGICAL) ×2
ELECTRODE REM PT RTRN 9FT ADLT (ELECTROSURGICAL) ×1 IMPLANT
EVACUATOR 1/8 PVC DRAIN (DRAIN) IMPLANT
GAUZE SPONGE 4X4 12PLY STRL (GAUZE/BANDAGES/DRESSINGS) ×3 IMPLANT
GLOVE SURG ENC MOIS LTX SZ6.5 (GLOVE) ×6 IMPLANT
GLOVE SURG ENC MOIS LTX SZ7.5 (GLOVE) ×6 IMPLANT
GLOVE SURG UNDER POLY LF SZ6.5 (GLOVE) ×2 IMPLANT
GLOVE SURG UNDER POLY LF SZ7.5 (GLOVE) ×2 IMPLANT
GOWN STRL REUS W/ TWL LRG LVL3 (GOWN DISPOSABLE) ×3 IMPLANT
GOWN STRL REUS W/ TWL XL LVL3 (GOWN DISPOSABLE) ×1 IMPLANT
GOWN STRL REUS W/TWL LRG LVL3 (GOWN DISPOSABLE) ×6
GOWN STRL REUS W/TWL XL LVL3 (GOWN DISPOSABLE) ×2
K-WIRE ACE 1.6X6 (WIRE) ×6
KIT BASIN OR (CUSTOM PROCEDURE TRAY) ×2 IMPLANT
KIT TURNOVER KIT B (KITS) ×2 IMPLANT
KWIRE ACE 1.6X6 (WIRE) IMPLANT
LOOP VESSEL MAXI BLUE (MISCELLANEOUS) ×2 IMPLANT
MANIFOLD NEPTUNE II (INSTRUMENTS) ×2 IMPLANT
NDL HYPO 25X1 1.5 SAFETY (NEEDLE) IMPLANT
NEEDLE HYPO 25X1 1.5 SAFETY (NEEDLE) IMPLANT
NS IRRIG 1000ML POUR BTL (IV SOLUTION) ×2 IMPLANT
PACK ORTHO EXTREMITY (CUSTOM PROCEDURE TRAY) ×2 IMPLANT
PAD ARMBOARD 7.5X6 YLW CONV (MISCELLANEOUS) ×4 IMPLANT
PAD CAST 3X4 CTTN HI CHSV (CAST SUPPLIES) IMPLANT
PAD CAST 4YDX4 CTTN HI CHSV (CAST SUPPLIES) IMPLANT
PADDING CAST COTTON 3X4 STRL (CAST SUPPLIES) ×2
PADDING CAST COTTON 4X4 STRL (CAST SUPPLIES) ×2
PADDING CAST COTTON 6X4 STRL (CAST SUPPLIES) ×1 IMPLANT
PIN GUIDE DRILL TIP 2.8X300 (DRILL) ×1 IMPLANT
PLATE LOCK RT SM (Plate) ×4 IMPLANT
PLATE LOCK RT SM 74X10.7X3.5X9 (Plate) IMPLANT
PLATE LOCK RT SM 88X10.9X2.5X9 (Plate) IMPLANT
SCREW CANC NONLOCK 2.5X26 (Screw) ×1 IMPLANT
SCREW CANC NONLOCK 2.5X28 (Screw) ×1 IMPLANT
SCREW CANN 90X16X6.5 NS (Screw) IMPLANT
SCREW CANNULATED 6.5X90MM (Screw) ×2 IMPLANT
SCREW CORT LP T15 3.5X16 (Screw) ×2 IMPLANT
SCREW CORTICAL 2.7MM 46MM (Screw) ×1 IMPLANT
SCREW CORTICAL 2.7MM 55MM (Screw) ×1 IMPLANT
SCREW LOCK CORT STAR 3.5X16 (Screw) ×1 IMPLANT
SCREW LOCK CORT STAR 3.5X24 (Screw) ×2 IMPLANT
SCREW LOCK CORT STAR 3.5X26 (Screw) ×1 IMPLANT
SCREW LP NL T15 3.5X22 (Screw) ×1 IMPLANT
SCREW LP NL T15 3.5X26 (Screw) ×1 IMPLANT
SCREW MDS 3.5X55 (Screw) ×1 IMPLANT
SCREW T15 MD 3.5X20MM NS (Screw) ×1 IMPLANT
SCREW TIS LP 3.5X18 NS (Screw) ×1 IMPLANT
SPONGE T-LAP 18X18 ~~LOC~~+RFID (SPONGE) IMPLANT
STAPLER VISISTAT 35W (STAPLE) IMPLANT
STOCKINETTE IMPERVIOUS 9X36 MD (GAUZE/BANDAGES/DRESSINGS) ×2 IMPLANT
STRIP CLOSURE SKIN 1/2X4 (GAUZE/BANDAGES/DRESSINGS) ×2 IMPLANT
SUCTION FRAZIER HANDLE 10FR (MISCELLANEOUS) ×2
SUCTION TUBE FRAZIER 10FR DISP (MISCELLANEOUS) ×1 IMPLANT
SUT ETHILON 3 0 PS 1 (SUTURE) ×4 IMPLANT
SUT MNCRL AB 3-0 PS2 18 (SUTURE) ×1 IMPLANT
SUT VIC AB 0 CT1 27 (SUTURE) ×4
SUT VIC AB 0 CT1 27XBRD ANBCTR (SUTURE) ×2 IMPLANT
SUT VIC AB 2-0 CT1 27 (SUTURE) ×4
SUT VIC AB 2-0 CT1 TAPERPNT 27 (SUTURE) ×2 IMPLANT
SYR 5ML LL (SYRINGE) IMPLANT
SYR CONTROL 10ML LL (SYRINGE) ×2 IMPLANT
TOWEL GREEN STERILE (TOWEL DISPOSABLE) ×2 IMPLANT
TOWEL GREEN STERILE FF (TOWEL DISPOSABLE) ×2 IMPLANT
TRAY FOLEY MTR SLVR 16FR STAT (SET/KITS/TRAYS/PACK) IMPLANT
WASHER FLAT 6.5MM (Washer) ×1 IMPLANT
WATER STERILE IRR 1000ML POUR (IV SOLUTION) ×1 IMPLANT
YANKAUER SUCT BULB TIP NO VENT (SUCTIONS) IMPLANT

## 2020-12-08 NOTE — Transfer of Care (Signed)
Immediate Anesthesia Transfer of Care Note  Patient: Vanessa Holmes  Procedure(s) Performed: OPEN REDUCTION INTERNAL FIXATION (ORIF) DISTAL HUMERUS FRACTURE (Right: Arm Upper)  Patient Location: PACU  Anesthesia Type:General  Level of Consciousness: awake, alert , oriented and patient cooperative  Airway & Oxygen Therapy: Patient Spontanous Breathing  Post-op Assessment: Report given to RN, Post -op Vital signs reviewed and stable and Patient moving all extremities X 4  Post vital signs: Reviewed and stable  Last Vitals:  Vitals Value Taken Time  BP    Temp    Pulse 92 12/08/20 1037  Resp    SpO2 97 % 12/08/20 1037  Vitals shown include unvalidated device data.  Last Pain:  Vitals:   12/08/20 0633  TempSrc:   PainSc: 3       Patients Stated Pain Goal: 3 (12/08/20 3888)  Complications: No notable events documented.

## 2020-12-08 NOTE — H&P (Signed)
Orthopaedic Trauma Service (OTS) H&P   Patient ID: NAQUITA NAPPIER MRN: [id number] DOB/AGE: 2000/07/12 19 y.o.  Reason for Surgery: Right distal humerus fracture  HPI: JARIANA SHUMARD is an 20 y.o. female who presents for ORIF of right distal humerus. RHD female that fell and sustained the above injury. She presents for ORIF  Past Medical History:  Diagnosis Date   GERD (gastroesophageal reflux disease)    H/O small bowel obstruction    Hypertension     Past Surgical History:  Procedure Laterality Date   SMALL INTESTINE SURGERY     68 days of age, bowel obstruction in Duodenum, redo 3 months later for scar tissure    Family History  Problem Relation Age of Onset   Hypertension Father    Diabetes Father    Cancer Maternal Grandmother        breast cancer in her 16's   Cancer Paternal Grandfather        cancer    Social History:  reports that she has never smoked. She has never used smokeless tobacco. She reports that she does not drink alcohol and does not use drugs.  Allergies:  Allergies  Allergen Reactions   Sulfa Antibiotics Other (See Comments)    Personality shifted (behavioral changes)    Medications: I have reviewed the patient's current medications.  ROS: Constitutional: No fever or chills Vision: No changes in vision ENT: No difficulty swallowing CV: No chest pain Pulm: No SOB or wheezing GI: No nausea or vomiting GU: No urgency or inability to hold urine Skin: No poor wound healing Neurologic: No numbness or tingling Psychiatric: No depression or anxiety Heme: No bruising Allergic: No reaction to medications or food   Exam: Blood pressure (!) 143/79, pulse 94, temperature 98.1 F (36.7 C), temperature source Oral, resp. rate 18, height 5\' 7"  (1.702 m), weight 74.8 kg, SpO2 93 %. General:NAD Orientation:AAOx3 Mood and Affect: Cooperative and pleasant Gait: WNL Coordination and balance: WNL  RUE: Splint in placed, clean and dry. Motor and  sensory intact. Compartments soft and compressible  LUE: Skin without lesions. No tenderness to palpation. Full painless ROM, full strength in each muscle groups without evidence of instability.   Medical Decision Making: Data: Imaging: X-rays of right elbow show displaced supracondylar distal humerus with intracondylar extensions  Labs: No results found for this or any previous visit (from the past 24 hour(s)).  Imaging or Labs ordered: None  Medical history and chart was reviewed and case discussed with medical provider.  Assessment/Plan: 20 yo female with right supracondylar/intracondylar distal humerus fracture  Patient requires ORIF with olecranon osteotomy. Risks and benefits discussed with patient and her family. They agree to proceed with surgery. Risks discussed included bleeding requiring blood transfusion, bleeding causing a hematoma, infection, malunion, nonunion, damage to surrounding nerves and blood vessels, pain, hardware prominence or irritation, hardware failure, stiffness, post-traumatic arthritis, DVT/PE,and even anesthetic complications. They agree to proceed.  26, MD [facility] [phone] (office) [email or web address]

## 2020-12-08 NOTE — Op Note (Signed)
Orthopaedic Surgery Operative Note (CSN: [id number] ) Date of Surgery: 12/08/2020  Admit Date: 12/08/2020   Diagnoses: Pre-Op Diagnoses: Right supracondylar/intracondylar distal humerus fracture  Post-Op Diagnosis: Same  Procedures: CPT 24586-Open reduction internal fixation of periarticular distal humerus fracture CPT 25360-Olecranon osteotomy  Surgeons : Primary: Roby Lofts, MD  Assistant: Ulyses Southward, PA-C  Location: OR 3   Anesthesia:General with regional block  Antibiotics: Ancef 2g preop with 1 gm vancomycin powder placed topically   Tourniquet time:None used    Estimated Blood Loss:120 mL  Complications:None   Specimens:None  Implants: Implant Name Type Inv. Item Serial No. Manufacturer Lot No. LRB No. Used Action  SCREW CANC NONLOCK 2.5X28 - [id number] Screw SCREW CANC NONLOCK 2.5X28  [employer](ORTH,TRAU,BIO,SG)  Right 1 Implanted  SCREW CANC NONLOCK 2.5X26 - [id number] Screw SCREW CANC NONLOCK 2.5X26  [employer](ORTH,TRAU,BIO,SG)  Right 1 Implanted  SCREW CORTICAL 2.7MM - [id number] Screw SCREW CORTICAL 2.7MM  [employer](ORTH,TRAU,BIO,SG)  Right 1 Implanted  SCREW CORTICAL 2.7MM - [id number] Screw SCREW CORTICAL 2.7MM  [employer](ORTH,TRAU,BIO,SG)  Right 1 Implanted  PLATE LOCK RT SM - [id number] Plate PLATE LOCK RT SM  [employer](ORTH,TRAU,BIO,SG)  Right 1 Implanted  SCREW CORT LP T15 3.5X16 - [id number] Screw SCREW CORT LP T15 3.5X16  [employer](ORTH,TRAU,BIO,SG)  Right 2 Implanted  SCREW LOCK CORT STAR 3.5X24 - [id number] Screw SCREW LOCK CORT STAR 3.5X24  [employer](ORTH,TRAU,BIO,SG)  Right 2 Implanted  3.5 mds screw X 55      Right 1 Implanted     Indications for Surgery: 20 year old right-hand-dominant female that sustained a right supracondylar/intercondylar distal humerus fracture.  Due to the unstable nature of her injury I recommend proceeding with open reduction internal fixation.  Risks included but not limited to  bleeding, infection, malunion, nonunion, hardware failure, hardware irritation, nerve or blood vessel injury, posttraumatic arthritis, elbow stiffness, DVT, even possible anesthetic complications.  The patient agreed to proceed with surgery and consent was obtained.  Operative Findings: 1.  Open reduction internal fixation of right supracondylar/intercondylar distal humerus fracture using [employer] ALPS distal humeral locking plate with a direct medial and posterior lateral plate.  Independent 2.7 millimeter screws for the intercondylar split as well as 2.5 mm positional screws for a medial butterfly fragment from the [employer] hand set 2.  Olecranon osteotomy with fixation using [employer] 6.5 mm partially-threaded 90 mm cannulated screw with a washer  Procedure: The patient was identified in the preoperative holding area. Consent was confirmed with the patient and their family and all questions were answered. The operative extremity was marked after confirmation with the patient. she was then brought back to the operating room by our anesthesia colleagues.  She was placed under general anesthetic and carefully transferred over to a radiolucent flat top table.  She was placed in the lateral decubitus position with the right side up.  All bony prominences were padded.  An axillary roll was placed to keep pressure off of the neurovascular structures in her axilla.  The right upper extremity was then prepped and draped in usual sterile fashion.  A timeout was performed to verify the patient, the procedure, and the extremity.  Preoperative antibiotics were dosed.  Fluoroscopic imaging was obtained to show the unstable nature of her injury.  A direct posterior approach was made and carried down through skin and subcutaneous tissue.  I incised through the triceps fascia and mobilized the skin flaps medially and laterally to expose the medial lateral  intermuscular septum.  I then released the  intermuscular septum on the lateral side and expose the posterior aspect of the distal humerus.  I extended my exposure into the anconeus to visualize the posterior articular surface.  I then identified the ulnar nerve and carefully dissected this from the triceps.  I protected this throughout the case.  Again I developed the intermuscular septum and expose the posterior aspect of the triceps and continued dissection of the ulnar nerve into the cubital tunnel.  I did not perform a transposition.  Once I had exposure complete I then focused on the olecranon osteotomy.  I guidewire for the 6.5 mm cannulated screws was directed appropriately using fluoroscopic guidance down the center of the canal.  I then drilled and placed a 90 mm screw to create a path.  I remove this and then I used an oscillating saw and a reverse chevron osteotomy and completed the osteotomy with a osteotome.  I was then able to release the remainder of the soft tissues and flipped the olecranon proximally to expose the articular surface of the distal humerus.  There was a single intra-articular split of the trochlea.  There was a medial butterfly fragment that still had soft tissue attached.  The lateral epicondyle and condyle were still in place and there was a relatively decent cortical read both on the medial and lateral side.  I for started out by reducing the medial butterfly fragment back to the humeral shaft.  I held it with reduction tenaculums.  I then placed 2.5 millimeter screws to provisionally hold this reduction from the [employer] hand set.  I was then able to reduce the articular surface using a reduction tenaculums.  I provisionally held this with a K wire.  I then was able to reduce the medial condyle to the medial metaphysis and then the lateral condyle to the lateral metaphysis as well and held this provisionally with a reduction tenaculums and K wires.  I confirmed adequate reduction using fluoroscopy.  I then  proceeded to place 2.44mm positional screws from lateral to medial to hold the intra-articular split.  I was then able to remove the K wires and clamps holding this reduction.  I then contoured a direct medial plate along the metaphysis and humeral shaft.  I held provisionally with a K wire and then placed nonlocking screws into the humeral shaft to bring it flush to bone.  I was then able to direct locking screws into the trochlea and I was able to use one of them to cross into the capitellum.  Once I had a medial fixation I turned my attention to the lateral side.  I used a posterior lateral plate and held provisionally with a K wire.  I then placed nonlocking screws into the humeral shaft to bring it flush to bone.  I then in situ contoured the distal screw times and then proceeded to place locking screws into the capitellum.  I made sure that I was within the bone and not penetrating the articular surface.  Nonlocking screws were placed into the metaphysis and humeral shaft to complete the construct.  All K wires were then removed.  Fluoroscopic imaging was obtained to show anatomic reduction and adequate placement of the screws.  The olecranon was then reduced.  The guidewire was placed back into the medullary canal of the ulna and I placed a 90 mm partially-threaded 6.5 mm cannulated screw with a washer to compress the olecranon osteotomy.  Excellent fixation  was obtained.  Final fluoroscopic imaging was obtained.  The incision was copiously irrigated.  A gram of vancomycin powder was placed into the incision.  The lateral intermuscular septum was closed with 0 Vicryl suture.  The medial intermuscular septum was left without repair.  There is no subluxation of the ulnar nerve.  I did not transpose it.  I then performed a layered closure of 0 Vicryl, 2-0 Vicryl and 3-0 Monocryl with Dermabond.  Sterile dressing was applied.  A soft dressing was placed.  The patient was then placed back in a sling awoken from  anesthesia and taken to the PACU in stable condition.  Post Op Plan/Instructions: Patient be nonweightbearing to the right upper extremity.  We will allow unrestricted range of motion of the elbow both passively and actively.  We will have her return in 2 weeks for x-rays and wound check.  No DVT prophylaxis is needed in this healthy upper extremity patient.  She will discharge home from the PACU.  I was present and performed the entire surgery.  Ulyses Southward, PA-C did assist me throughout the case. An assistant was necessary given the difficulty in approach, maintenance of reduction and ability to instrument the fracture.   Truitt Merle, MD [facility]

## 2020-12-08 NOTE — Anesthesia Procedure Notes (Signed)
Procedure Name: Intubation Date/Time: 12/08/2020 7:40 AM Performed by: Jonna Munro, CRNA Pre-anesthesia Checklist: Patient identified, Emergency Drugs available, Suction available, Patient being monitored and Timeout performed Patient Re-evaluated:Patient Re-evaluated prior to induction Oxygen Delivery Method: Circle system utilized Preoxygenation: Pre-oxygenation with 100% oxygen Induction Type: IV induction Laryngoscope Size: Mac and 4 Grade View: Grade I Tube type: Oral Tube size: 7.5 mm Number of attempts: 1 Airway Equipment and Method: Stylet Placement Confirmation: positive ETCO2, ETT inserted through vocal cords under direct vision and breath sounds checked- equal and bilateral Secured at: 22 cm Tube secured with: Tape Dental Injury: Teeth and Oropharynx as per pre-operative assessment

## 2020-12-08 NOTE — Addendum Note (Signed)
Addendum  created 12/08/20 1721 by Shanon Payor, CRNA   Intraprocedure Event edited

## 2020-12-08 NOTE — Discharge Instructions (Addendum)
[facility] Discharge Instructions   General Discharge Instructions  WEIGHT BEARING STATUS:Non-weightbearing on right upper extremity.   RANGE OF MOTION/ACTIVITY: Ok to come out of your sling to begin some gentle elbow motion as you can tolerate. Wear your sling as needed for comfort  Wound Care: You may remove your surgical dressing on post-op day #2 (Saturday 12/10/20).  Incisions can be left open to air if there is no drainage. If incision continues to have drainage, follow wound care instructions below. Okay to shower if no drainage from incisions.  DVT/PE prophylaxis: None  Diet: as you were eating previously.  Can use over the counter stool softeners and bowel preparations, such as Miralax, to help with bowel movements.  Narcotics can be constipating.  Be sure to drink plenty of fluids  PAIN MEDICATION USE AND EXPECTATIONS  You have likely been given narcotic medications to help control your pain.  After a traumatic event that results in an fracture (broken bone) with or without surgery, it is ok to use narcotic pain medications to help control one's pain.  We understand that everyone responds to pain differently and each individual patient will be evaluated on a regular basis for the continued need for narcotic medications. Ideally, narcotic medication use should last no more than 6-8 weeks (coinciding with fracture healing).   As a patient it is your responsibility as well to monitor narcotic medication use and report the amount and frequency you use these medications when you come to your office visit.   We would also advise that if you are using narcotic medications, you should take a dose prior to therapy to maximize you participation.  IF YOU ARE ON NARCOTIC MEDICATIONS IT IS NOT PERMISSIBLE TO OPERATE A MOTOR VEHICLE (MOTORCYCLE/CAR/TRUCK/MOPED) OR HEAVY MACHINERY DO NOT MIX NARCOTICS WITH OTHER CNS (CENTRAL NERVOUS SYSTEM) DEPRESSANTS SUCH AS ALCOHOL   STOP  SMOKING OR USING NICOTINE PRODUCTS!!!!  As discussed nicotine severely impairs your body's ability to heal surgical and traumatic wounds but also impairs bone healing.  Wounds and bone heal by forming microscopic blood vessels (angiogenesis) and nicotine is a vasoconstrictor (essentially, shrinks blood vessels).  Therefore, if vasoconstriction occurs to these microscopic blood vessels they essentially disappear and are unable to deliver necessary nutrients to the healing tissue.  This is one modifiable factor that you can do to dramatically increase your chances of healing your injury.    (This means no smoking, no nicotine gum, patches, etc)  DO NOT USE NONSTEROIDAL ANTI-INFLAMMATORY DRUGS (NSAID'S)  Using products such as Advil (ibuprofen), Aleve (naproxen), Motrin (ibuprofen) for additional pain control during fracture healing can delay and/or prevent the healing response.  If you would like to take over the counter (OTC) medication, Tylenol (acetaminophen) is ok.  However, some narcotic medications that are given for pain control contain acetaminophen as well. Therefore, you should not exceed more than 4000 mg of tylenol in a day if you do not have liver disease.  Also note that there are may OTC medicines, such as cold medicines and allergy medicines that my contain tylenol as well.  If you have any questions about medications and/or interactions please ask your doctor/PA or your pharmacist.      ICE AND ELEVATE INJURED/OPERATIVE EXTREMITY  Using ice and elevating the injured extremity above your heart can help with swelling and pain control.  Icing in a pulsatile fashion, such as 20 minutes on and 20 minutes off, can be followed.    Do not place  ice directly on skin. Make sure there is a barrier between to skin and the ice pack.    Using frozen items such as frozen peas works well as the conform nicely to the are that needs to be iced.  USE AN ACE WRAP OR TED HOSE FOR SWELLING CONTROL  In  addition to icing and elevation, Ace wraps or TED hose are used to help limit and resolve swelling.  It is recommended to use Ace wraps or TED hose until you are informed to stop.    When using Ace Wraps start the wrapping distally (farthest away from the body) and wrap proximally (closer to the body)   Example: If you had surgery on your leg or thing and you do not have a splint on, start the ace wrap at the toes and work your way up to the thigh        If you had surgery on your upper extremity and do not have a splint on, start the ace wrap at your fingers and work your way up to the upper arm   CALL THE OFFICE WITH ANY QUESTIONS/CONCERNS OR FOR MEDICATION REFILL: [phone]   VISIT OUR WEBSITE FOR ADDITIONAL INFORMATION: [email or web address]    Discharge Wound Care Instructions  Do NOT apply any ointments, solutions or lotions to pin sites or surgical wounds.  These prevent needed drainage and even though solutions like hydrogen peroxide kill bacteria, they also damage cells lining the pin sites that help fight infection.  Applying lotions or ointments can keep the wounds moist and can cause them to breakdown and open up as well. This can increase the risk for infection. When in doubt call the office.  If any drainage is noted, use one layer of adaptic, then gauze, Kerlix, and an ace wrap.  Once the incision is completely dry and without drainage, it may be left open to air out.  Showering may begin 36-48 hours later.  Cleaning gently with soap and water.

## 2020-12-08 NOTE — Anesthesia Postprocedure Evaluation (Signed)
Anesthesia Post Note  Patient: Vanessa Holmes  Procedure(s) Performed: OPEN REDUCTION INTERNAL FIXATION (ORIF) DISTAL HUMERUS FRACTURE (Right: Arm Upper)     Patient location during evaluation: PACU Anesthesia Type: General Level of consciousness: awake and alert Pain management: pain level controlled Vital Signs Assessment: post-procedure vital signs reviewed and stable Respiratory status: spontaneous breathing, nonlabored ventilation, respiratory function stable and patient connected to nasal cannula oxygen Cardiovascular status: blood pressure returned to baseline and stable Postop Assessment: no apparent nausea or vomiting Anesthetic complications: no   No notable events documented.  Last Vitals:  Vitals:   12/08/20 1053 12/08/20 1105  BP: 122/77 123/78  Pulse: 85 86  Resp: 19 19  Temp:  (!) 36.1 C  SpO2: 97% 97%    Last Pain:  Vitals:   12/08/20 1039  TempSrc:   PainSc: 0-No pain                 Kennieth Rad

## 2020-12-08 NOTE — Anesthesia Procedure Notes (Signed)
Anesthesia Regional Block: Supraclavicular block   Pre-Anesthetic Checklist: , timeout performed,  Correct Patient, Correct Site, Correct Laterality,  Correct Procedure, Correct Position, site marked,  Risks and benefits discussed,  Surgical consent,  Pre-op evaluation,  At surgeon's request and post-op pain management  Laterality: Right  Prep: chloraprep       Needles:  Injection technique: Single-shot  Needle Type: Echogenic Stimulator Needle     Needle Length: 9cm  Needle Gauge: 21     Additional Needles:   Procedures:, nerve stimulator,,, ultrasound used (permanent image in chart),,     Nerve Stimulator or Paresthesia:  Response: deltoid and tricep, 0.5 mA  Additional Responses:   Narrative:  Start time: 12/08/2020 6:57 AM End time: 12/08/2020 7:04 AM Injection made incrementally with aspirations every 5 mL.  Performed by: Personally  Anesthesiologist: Marcene Duos, MD

## 2020-12-13 ENCOUNTER — Encounter (HOSPITAL_COMMUNITY): Payer: Self-pay | Admitting: Student

## 2021-10-15 IMAGING — CR DG ELBOW 2V*R*
2 series · 2 of 2 positions shown · non-contrast
Comparison: None.

CLINICAL DATA: Fall, right elbow deformity

EXAM:
RIGHT ELBOW - 2 VIEW

[x elbow ap right]
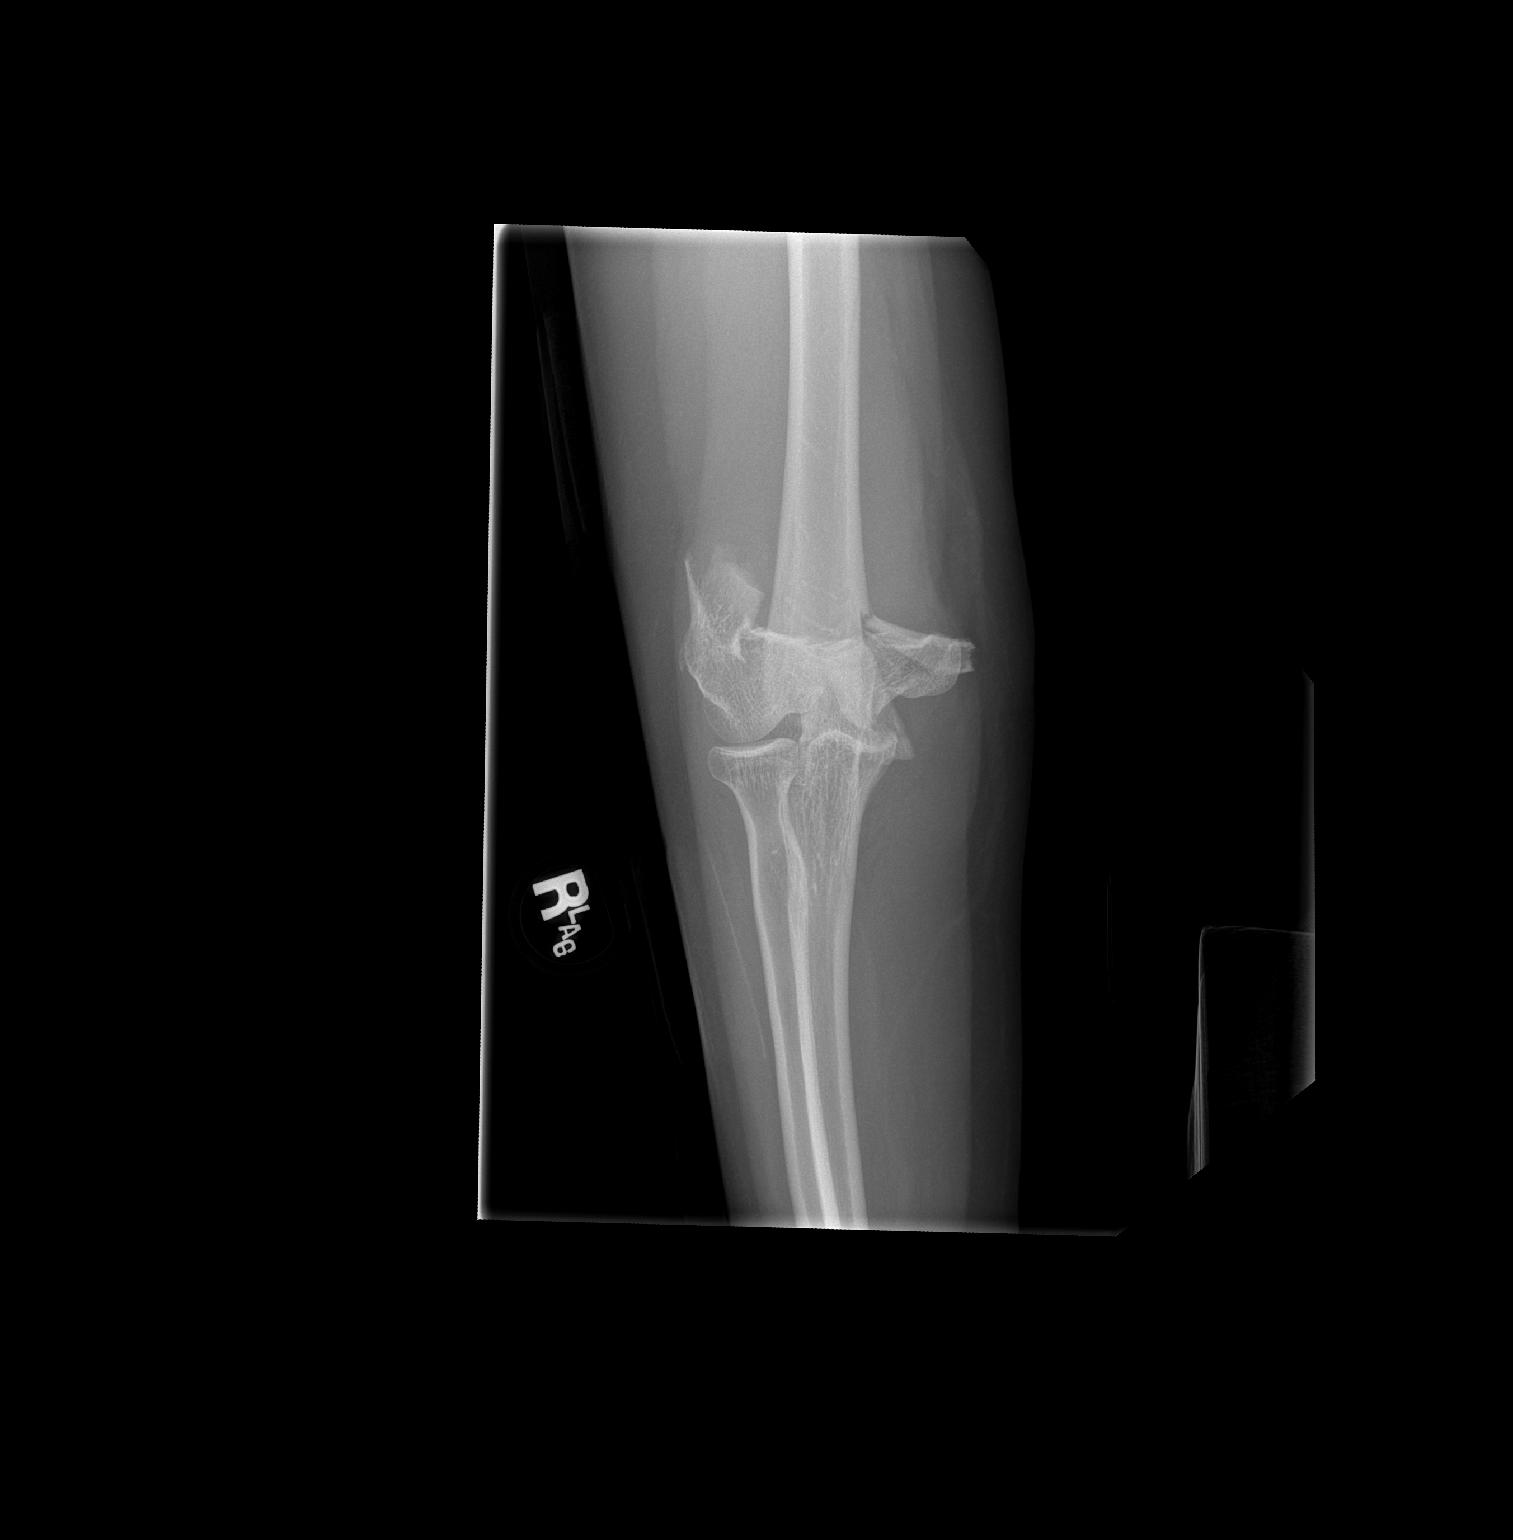

[x elbow lat right]
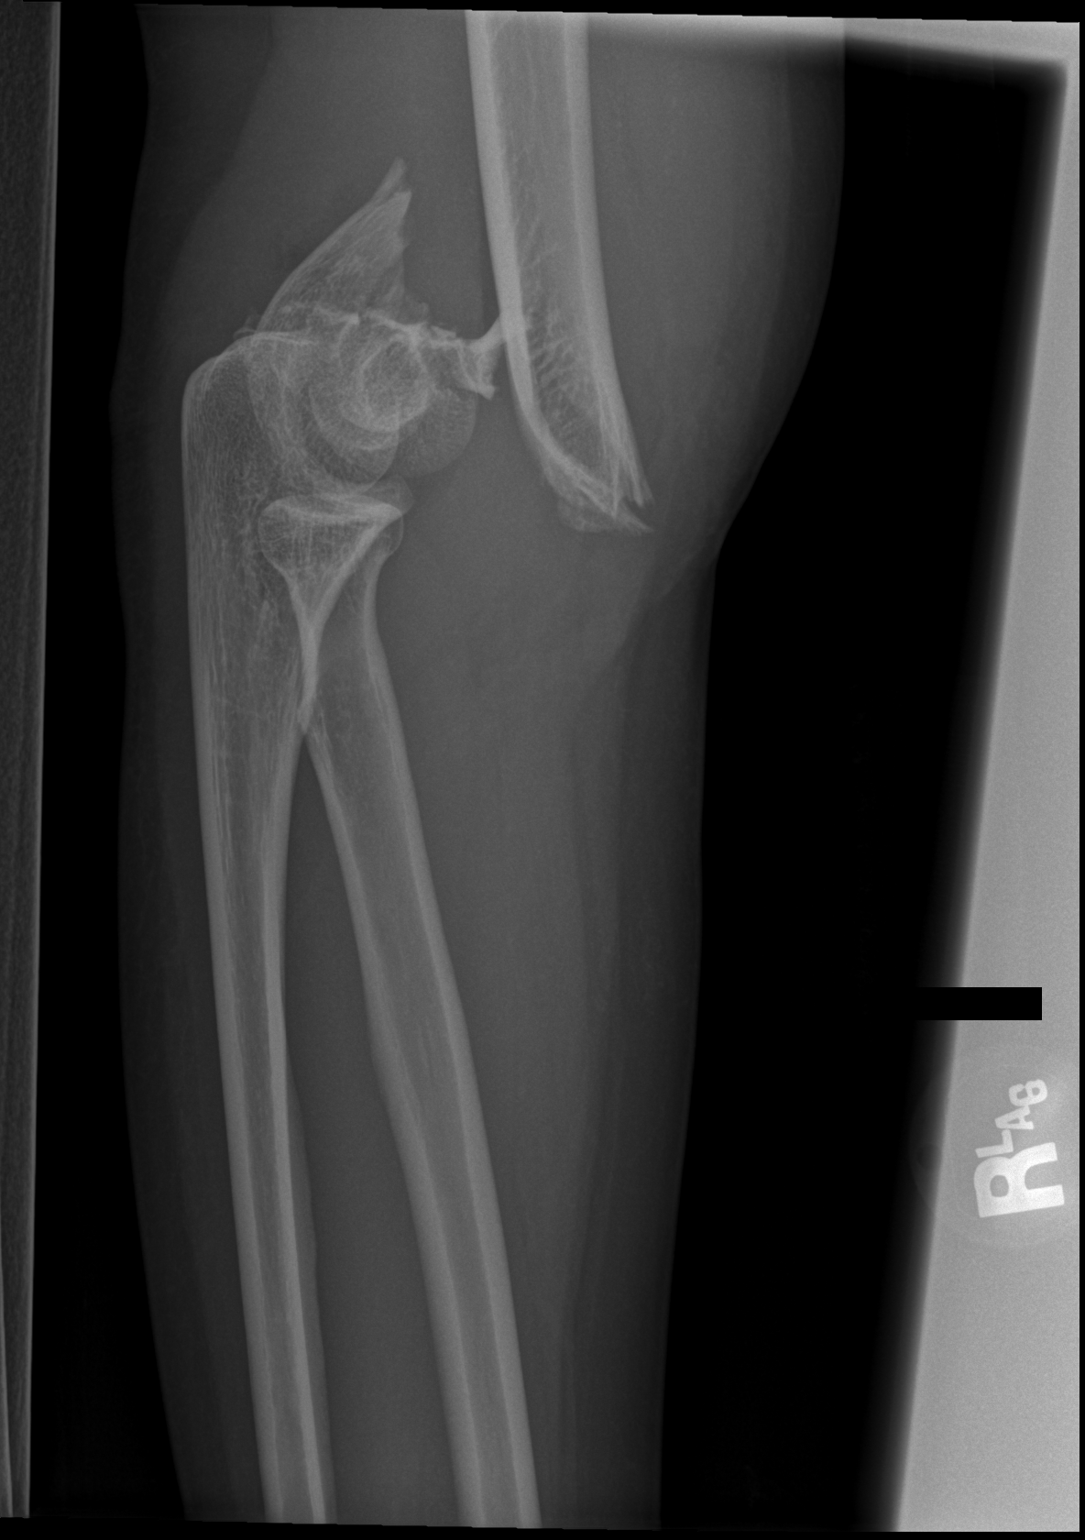

[2 of 2 positions shown; findings below may reference images not displayed]

FINDINGS: Two view radiograph right elbow demonstrates a comminuted
transcondylar/intra-articular fracture of the a distal right humerus
with marked posterior angulation, 2 shaft with posterior
displacement and approximately 3.5-4 cm override of the fracture
fragments with superior displacement of the a distal humeral
fracture fragments. Radiocapitellar alignment appears preserved. The
trochlear groove appears disrupted with resultant articular
incongruity involving the ulnohumeral articulation.
IMPRESSION: Comminuted transcondylar/intra-articular fracture of the distal
right humerus, as described above.
